# Patient Record
Sex: Male | Born: 1975 | Race: White | Hispanic: No | Marital: Single | Smoking: Current every day smoker
Health system: Southern US, Community
[De-identification: ages and names within clinical notes are randomized; demographics above are authoritative.]

## PROBLEM LIST (undated history)

## (undated) DIAGNOSIS — G43909 Migraine, unspecified, not intractable, without status migrainosus: Secondary | ICD-10-CM

## (undated) DIAGNOSIS — G2581 Restless legs syndrome: Secondary | ICD-10-CM

## (undated) DIAGNOSIS — I10 Essential (primary) hypertension: Secondary | ICD-10-CM

## (undated) DIAGNOSIS — F419 Anxiety disorder, unspecified: Secondary | ICD-10-CM

## (undated) HISTORY — PX: APPENDECTOMY: SHX54

---

## 2008-05-09 ENCOUNTER — Emergency Department (HOSPITAL_COMMUNITY): Admission: EM | Admit: 2008-05-09 | Discharge: 2008-05-09 | Payer: Self-pay | Admitting: Emergency Medicine

## 2016-07-02 ENCOUNTER — Encounter: Payer: Self-pay | Admitting: Intensive Care

## 2016-07-02 ENCOUNTER — Emergency Department
Admission: EM | Admit: 2016-07-02 | Discharge: 2016-07-02 | Disposition: A | Discharge status: Home / Self Care | Payer: Self-pay | Attending: Emergency Medicine | Admitting: Emergency Medicine

## 2016-07-02 DIAGNOSIS — R1010 Upper abdominal pain, unspecified: Secondary | ICD-10-CM | POA: Insufficient documentation

## 2016-07-02 DIAGNOSIS — R1030 Lower abdominal pain, unspecified: Secondary | ICD-10-CM | POA: Insufficient documentation

## 2016-07-02 DIAGNOSIS — R112 Nausea with vomiting, unspecified: Secondary | ICD-10-CM | POA: Insufficient documentation

## 2016-07-02 DIAGNOSIS — F17210 Nicotine dependence, cigarettes, uncomplicated: Secondary | ICD-10-CM | POA: Insufficient documentation

## 2016-07-02 LAB — COMPREHENSIVE METABOLIC PANEL
ALBUMIN: 4 g/dL (ref 3.5–5.0)
ALT: 21 U/L (ref 17–63)
ANION GAP: 8 (ref 5–15)
AST: 23 U/L (ref 15–41)
Alkaline Phosphatase: 60 U/L (ref 38–126)
BILIRUBIN TOTAL: 0.5 mg/dL (ref 0.3–1.2)
BUN: 9 mg/dL (ref 6–20)
CALCIUM: 9.3 mg/dL (ref 8.9–10.3)
CO2: 25 mmol/L (ref 22–32)
CREATININE: 1.09 mg/dL (ref 0.61–1.24)
Chloride: 105 mmol/L (ref 101–111)
GFR calc Af Amer: >60 mL/min (ref >60)
GFR calc non Af Amer: >60 mL/min (ref >60)
GLUCOSE: 92 mg/dL (ref 65–99)
POTASSIUM: 4.2 mmol/L (ref 3.5–5.1)
SODIUM: 138 mmol/L (ref 135–145)
TOTAL PROTEIN: 7.1 g/dL (ref 6.5–8.1)

## 2016-07-02 LAB — CBC WITH DIFFERENTIAL/PLATELET
BASOS ABS: 0 10*3/uL (ref 0–0.1)
Basophils Relative: 0 %
Eosinophils Absolute: 0.1 10*3/uL (ref 0–0.7)
Eosinophils Relative: 1 %
HEMATOCRIT: 44.6 % (ref 40.0–52.0)
Hemoglobin: 15.5 g/dL (ref 13.0–18.0)
LYMPHS ABS: 1.6 10*3/uL (ref 1.0–3.6)
Lymphocytes Relative: 22 %
MCH: 30.3 pg (ref 26.0–34.0)
MCHC: 34.7 g/dL (ref 32.0–36.0)
MCV: 87.2 fL (ref 80.0–100.0)
MONO ABS: 0.6 10*3/uL (ref 0.2–1.0)
MONOS PCT: 8 %
NEUTROS ABS: 5 10*3/uL (ref 1.4–6.5)
Neutrophils Relative %: 69 %
Platelets: 275 10*3/uL (ref 150–440)
RBC: 5.11 MIL/uL (ref 4.40–5.90)
RDW: 12.9 % (ref 11.5–14.5)
WBC: 7.3 10*3/uL (ref 3.8–10.6)

## 2016-07-02 LAB — URINALYSIS, COMPLETE (UACMP) WITH MICROSCOPIC
Bacteria, UA: NONE SEEN
Bilirubin Urine: NEGATIVE
GLUCOSE, UA: NEGATIVE mg/dL
Ketones, ur: 5 mg/dL — AB
Leukocytes, UA: NEGATIVE
NITRITE: NEGATIVE
PH: 5 (ref 5.0–8.0)
PROTEIN: 30 mg/dL — AB
SPECIFIC GRAVITY, URINE: 1.028 (ref 1.005–1.030)
Squamous Epithelial / LPF: NONE SEEN

## 2016-07-02 LAB — LIPASE, BLOOD: Lipase: 20 U/L (ref 11–51)

## 2016-07-02 MED ORDER — ONDANSETRON 4 MG PO TBDP
4.0000 mg | ORAL_TABLET | Freq: Once | ORAL | Status: AC | PRN
  Administered 2016-07-02: 4 mg via ORAL
  Filled 2016-07-02: qty 1

## 2016-07-02 MED ORDER — ONDANSETRON 4 MG PO TBDP: 4.0000 mg | ORAL_TABLET | Freq: Three times a day (TID) | ORAL | 0 refills | Status: AC | PRN

## 2016-07-02 MED ORDER — MORPHINE SULFATE (PF) 4 MG/ML IV SOLN
4.0000 mg | Freq: Once | INTRAVENOUS | Status: AC
  Administered 2016-07-02: 4 mg via INTRAVENOUS
  Filled 2016-07-02: qty 1

## 2016-07-02 MED ORDER — SODIUM CHLORIDE 0.9 % IV SOLN
Freq: Once | INTRAVENOUS | Status: AC
  Administered 2016-07-02: 14:00:00 via INTRAVENOUS

## 2016-07-02 MED ORDER — SODIUM CHLORIDE 0.9 % IV SOLN
Freq: Once | INTRAVENOUS | Status: AC
  Administered 2016-07-02: 1000 mL via INTRAVENOUS

## 2016-07-02 MED ORDER — FAMOTIDINE 20 MG PO TABS: 20.0000 mg | ORAL_TABLET | Freq: Two times a day (BID) | ORAL | 1 refills | Status: AC

## 2016-07-02 MED ORDER — ONDANSETRON HCL 4 MG/2ML IJ SOLN
4.0000 mg | Freq: Once | INTRAMUSCULAR | Status: AC
  Administered 2016-07-02: 4 mg via INTRAVENOUS
  Filled 2016-07-02: qty 2

## 2016-07-02 NOTE — ED Triage Notes (Signed)
Patient arrived by POV for c/o emesis and lower abdominal pain since last night. He believes he may have eaten chicken that was not cooked all the way. Denies diarrhea. Multiple episodes of emesis since last night. Ambulatory in triage with no problems.

## 2016-07-02 NOTE — ED Provider Notes (Signed)
[facility] Emergency Department Provider Note       Time seen: ----------------------------------------- 1:02 PM on 07/02/2016 -----------------------------------------     I have reviewed the triage vital signs and the nursing notes.   HISTORY   Chief Complaint Abdominal Pain and Emesis    HPI Alex Miranda is a 41 y.o. male who presents to the ED for vomiting with lower abdominal pain since last night. Patient believes he may have eaten chicken that was not cooked well now. He denies any diarrhea, he had multiple episodes of vomiting last night. Currently pain is 5 out of 10 in the lower abdomen. Nothing makes it better or worse. Patient feels dehydrated.   History reviewed. No pertinent past medical history.  There are no active problems to display for this patient.   Past Surgical History:  Procedure Laterality Date  . APPENDECTOMY      Allergies Patient has no known allergies.  Social History Social History  Substance Use Topics  . Smoking status: Current Every Day Smoker    Packs/day: 1.00    Types: Cigarettes  . Smokeless tobacco: Never Used  . Alcohol use No    Review of Systems Constitutional: Negative for fever. Cardiovascular: Negative for chest pain. Respiratory: Negative for shortness of breath. Gastrointestinal: Positive for abdominal pain, vomiting Genitourinary: Negative for dysuria. Musculoskeletal: Negative for back pain. Skin: Negative for rash. Neurological: Negative for headaches, focal weakness or numbness.  10-point ROS otherwise negative.  ____________________________________________   PHYSICAL EXAM:  VITAL SIGNS: ED Triage Vitals  Enc Vitals Group     BP 07/02/16 1143 (!) 141/95     Pulse Rate 07/02/16 1143 92     Resp 07/02/16 1143 18     Temp 07/02/16 1143 97.5 F (36.4 C)     Temp Source 07/02/16 1143 Oral     SpO2 07/02/16 1143 100 %     Weight 07/02/16 1143 215 lb (97.5 kg)     Height  07/02/16 1143 6' (1.829 m)     Head Circumference --      Peak Flow --      Pain Score 07/02/16 1159 5     Pain Loc --      Pain Edu? --      Excl. in GC? --     Constitutional: Alert and oriented. Well appearing and in no distress. Eyes: Conjunctivae are normal. PERRL. Normal extraocular movements. ENT   Head: Normocephalic and atraumatic.   Nose: No congestion/rhinnorhea.   Mouth/Throat: Mucous membranes are moist.   Neck: No stridor. Cardiovascular: Normal rate, regular rhythm. No murmurs, rubs, or gallops. Respiratory: Normal respiratory effort without tachypnea nor retractions. Breath sounds are clear and equal bilaterally. No wheezes/rales/rhonchi. Gastrointestinal: Soft and nontender. Nonfocal abdominal tenderness, no rebound or guarding. Normal bowel sounds. Musculoskeletal: Nontender with normal range of motion in extremities. No lower extremity tenderness nor edema. Neurologic:  Normal speech and language. No gross focal neurologic deficits are appreciated.  Skin:  Skin is warm, dry and intact. No rash noted. Psychiatric: Mood and affect are normal. Speech and behavior are normal.   ____________________________________________  ED COURSE:  Pertinent labs & imaging results that were available during my care of the patient were reviewed by me and considered in my medical decision making (see chart for details). Patient presents for abdominal pain and vomiting, we will assess with labs and imaging as indicated.   Procedures ____________________________________________   LABS (pertinent positives/negatives)  Labs Reviewed  URINALYSIS, COMPLETE (UACMP)  WITH MICROSCOPIC - Abnormal; Notable for the following:       Result Value   Color, Urine YELLOW (*)    APPearance CLEAR (*)    Hgb urine dipstick SMALL (*)    Ketones, ur 5 (*)    Protein, ur 30 (*)    All other components within normal limits  LIPASE, BLOOD  COMPREHENSIVE METABOLIC PANEL  CBC WITH  DIFFERENTIAL/PLATELET   ____________________________________________  FINAL ASSESSMENT AND PLAN  Abdominal pain and vomiting  Plan: Patient's labs and imaging were dictated above. Patient had presented for abdominal pain and vomiting, currently he is improved with fluids and antiemetics. He is stable for outpatient follow-up.   Emily Filbert, MD   Note: This note was generated in part or whole with voice recognition software. Voice recognition is usually quite accurate but there are transcription errors that can and very often do occur. I apologize for any typographical errors that were not detected and corrected.     Emily Filbert, MD 07/02/16 1318

## 2016-08-13 ENCOUNTER — Emergency Department: Payer: 59

## 2016-08-13 ENCOUNTER — Emergency Department
Admission: EM | Admit: 2016-08-13 | Discharge: 2016-08-13 | Disposition: A | Discharge status: Home / Self Care | Payer: 59 | Attending: Student in an Organized Health Care Education/Training Program | Admitting: Student in an Organized Health Care Education/Training Program

## 2016-08-13 ENCOUNTER — Encounter: Payer: Self-pay | Admitting: Intensive Care

## 2016-08-13 DIAGNOSIS — I10 Essential (primary) hypertension: Secondary | ICD-10-CM | POA: Insufficient documentation

## 2016-08-13 DIAGNOSIS — F17210 Nicotine dependence, cigarettes, uncomplicated: Secondary | ICD-10-CM | POA: Insufficient documentation

## 2016-08-13 DIAGNOSIS — K1120 Sialoadenitis, unspecified: Secondary | ICD-10-CM | POA: Insufficient documentation

## 2016-08-13 DIAGNOSIS — H9202 Otalgia, left ear: Secondary | ICD-10-CM | POA: Diagnosis present

## 2016-08-13 HISTORY — DX: Anxiety disorder, unspecified: F41.9

## 2016-08-13 HISTORY — DX: Essential (primary) hypertension: I10

## 2016-08-13 HISTORY — DX: Restless legs syndrome: G25.81

## 2016-08-13 LAB — CBC WITH DIFFERENTIAL/PLATELET
Basophils Absolute: 0 10*3/uL (ref 0–0.1)
Basophils Relative: 0 %
EOS PCT: 1 %
Eosinophils Absolute: 0 10*3/uL (ref 0–0.7)
HCT: 43.9 % (ref 40.0–52.0)
Hemoglobin: 15.1 g/dL (ref 13.0–18.0)
LYMPHS ABS: 1.1 10*3/uL (ref 1.0–3.6)
Lymphocytes Relative: 19 %
MCH: 29.6 pg (ref 26.0–34.0)
MCHC: 34.4 g/dL (ref 32.0–36.0)
MCV: 86 fL (ref 80.0–100.0)
MONO ABS: 0.4 10*3/uL (ref 0.2–1.0)
Monocytes Relative: 7 %
Neutro Abs: 4.1 10*3/uL (ref 1.4–6.5)
Neutrophils Relative %: 73 %
PLATELETS: 212 10*3/uL (ref 150–440)
RBC: 5.11 MIL/uL (ref 4.40–5.90)
RDW: 12.8 % (ref 11.5–14.5)
WBC: 5.7 10*3/uL (ref 3.8–10.6)

## 2016-08-13 LAB — URINALYSIS, COMPLETE (UACMP) WITH MICROSCOPIC
Bilirubin Urine: NEGATIVE
GLUCOSE, UA: NEGATIVE mg/dL
KETONES UR: NEGATIVE mg/dL
Leukocytes, UA: NEGATIVE
NITRITE: NEGATIVE
PH: 6 (ref 5.0–8.0)
PROTEIN: 30 mg/dL — AB
Specific Gravity, Urine: 1.027 (ref 1.005–1.030)
WBC UA: NONE SEEN WBC/hpf (ref 0–5)

## 2016-08-13 LAB — COMPREHENSIVE METABOLIC PANEL
ALT: 19 U/L (ref 17–63)
AST: 23 U/L (ref 15–41)
Albumin: 4.1 g/dL (ref 3.5–5.0)
Alkaline Phosphatase: 66 U/L (ref 38–126)
Anion gap: 6 (ref 5–15)
BUN: 15 mg/dL (ref 6–20)
CHLORIDE: 105 mmol/L (ref 101–111)
CO2: 28 mmol/L (ref 22–32)
Calcium: 9.4 mg/dL (ref 8.9–10.3)
Creatinine, Ser: 1.01 mg/dL (ref 0.61–1.24)
Glucose, Bld: 101 mg/dL — ABNORMAL HIGH (ref 65–99)
POTASSIUM: 4.2 mmol/L (ref 3.5–5.1)
Sodium: 139 mmol/L (ref 135–145)
Total Bilirubin: 0.3 mg/dL (ref 0.3–1.2)
Total Protein: 7.3 g/dL (ref 6.5–8.1)

## 2016-08-13 MED ORDER — IOPAMIDOL (ISOVUE-300) INJECTION 61%
75.0000 mL | Freq: Once | INTRAVENOUS | Status: AC | PRN
  Administered 2016-08-13: 75 mL via INTRAVENOUS
  Filled 2016-08-13: qty 75

## 2016-08-13 MED ORDER — PREDNISONE 20 MG PO TABS: 20.0000 mg | ORAL_TABLET | Freq: Every day | ORAL | 0 refills | Status: AC

## 2016-08-13 MED ORDER — KETOROLAC TROMETHAMINE 30 MG/ML IJ SOLN
30.0000 mg | Freq: Once | INTRAMUSCULAR | Status: AC
  Administered 2016-08-13: 30 mg via INTRAVENOUS
  Filled 2016-08-13: qty 1

## 2016-08-13 NOTE — Discharge Instructions (Signed)
Your exam, labs, and CT scan were normal today. There is no sign of infection or abscess. Take the prescription steroid for swelling and inflammation. Drink plenty of fluid and follow-up with the ear, nose, & throat specialist as needed. Select a local clinic for routine medical care. Return to the ED as needed.

## 2016-08-13 NOTE — ED Triage Notes (Signed)
Patient presents today with L ear pain X2 days. Minimal swelling noted to L jaw. Denies trouble swallowing or SOB. A&O x4

## 2016-08-13 NOTE — ED Provider Notes (Signed)
[facility] Emergency Department Provider Note ____________________________________________  Time seen: 1527  I have reviewed the triage vital signs and the nursing notes.  HISTORY  Chief Complaint  Otalgia  HPI Alex Miranda is a 41 y.o. male presents to the ED with complaints of left ear pain for the last 2 days. Patient denies any fevers, but notes chills and sweats.He denies any injury, accident, or trauma. He also denies any hearing changes, vertigo, or tinnitus. He notes a mild headache as well as pain and pressure inside the left ear. He denies any recent dental work, ear drainage, or water in the ear. He overall reports to feeling of general malaise. He dosed 400mg  of ibuprofen this morning for pain.  Past Medical History:  Diagnosis Date  . Anxiety   . Hypertension   . Restless leg syndrome     There are no active problems to display for this patient.   Past Surgical History:  Procedure Laterality Date  . APPENDECTOMY      Prior to Admission medications   Medication Sig Start Date End Date Taking? Authorizing Provider  famotidine (PEPCID) 20 MG tablet Take 1 tablet (20 mg total) by mouth 2 (two) times daily. 07/02/16   Emily FilbertWilliams, Jonathan E, MD  ondansetron (ZOFRAN ODT) 4 MG disintegrating tablet Take 1 tablet (4 mg total) by mouth every 8 (eight) hours as needed for nausea or vomiting. 07/02/16   Emily FilbertWilliams, Jonathan E, MD  predniSONE (DELTASONE) 20 MG tablet Take 1 tablet (20 mg total) by mouth daily with breakfast. 08/13/16 08/18/16  Kaegan Hettich, Charlesetta IvoryJenise V Bacon, PA-C    Allergies Patient has no known allergies.  History reviewed. No pertinent family history.  Social History Social History  Substance Use Topics  . Smoking status: Current Every Day Smoker    Packs/day: 1.00    Types: Cigarettes  . Smokeless tobacco: Never Used  . Alcohol use No    Review of Systems  Constitutional: Negative for fever. Admits to chills and sweats Eyes:  Negative for visual changes. ENT: Negative for sore throat. Left earache as above. Cardiovascular: Negative for chest pain. Respiratory: Negative for shortness of breath. Gastrointestinal: Negative for abdominal pain, vomiting and diarrhea. Skin: Negative for rash. Neurological: Negative for focal weakness or numbness. Reports general headache. ____________________________________________  PHYSICAL EXAM:  VITAL SIGNS: ED Triage Vitals  Enc Vitals Group     BP 08/13/16 1503 (!) 138/100     Pulse Rate 08/13/16 1503 96     Resp 08/13/16 1503 18     Temp 08/13/16 1503 98.3 F (36.8 C)     Temp Source 08/13/16 1503 Oral     SpO2 08/13/16 1503 99 %     Weight --      Height --      Head Circumference --      Peak Flow --      Pain Score 08/13/16 1535 8     Pain Loc --      Pain Edu? --      Excl. in GC? --     Constitutional: Alert and oriented. Well appearing and in no distress. Head: Normocephalic and atraumatic. She with subtle fullness noted to the left cheek and jaw region. Eyes: Conjunctivae are normal. PERRL. Normal extraocular movements Ears: Canals clear without edema or exudate bilaterally. TMs intact bilaterally, no erythema, bulging, or effusion noted. Nose: No congestion/rhinorrhea/epistaxis. Mouth/Throat: Mucous membranes are moist. Uvula is midline and tonsils are flat. No gum swelling, or sublingual erythema  noted. Neck: Supple. No thyromegaly. Hematological/Lymphatic/Immunological: No cervical lymphadenopathy. Cardiovascular: Normal rate, regular rhythm. Normal distal pulses. Respiratory: Normal respiratory effort. No wheezes/rales/rhonchi. Musculoskeletal: Mandible and jaw movement without indication of TMJ dysfunction. Nontender with normal range of motion in all extremities.  Neurologic:  Normal gait without ataxia. Normal speech and language. No gross focal neurologic deficits are appreciated. Skin:  Skin is warm, dry and intact. No rash  noted. ____________________________________________   LABS (pertinent positives/negatives) Labs Reviewed  COMPREHENSIVE METABOLIC PANEL - Abnormal; Notable for the following:       Result Value   Glucose, Bld 101 (*)    All other components within normal limits  URINALYSIS, COMPLETE (UACMP) WITH MICROSCOPIC - Abnormal; Notable for the following:    Color, Urine YELLOW (*)    APPearance CLEAR (*)    Hgb urine dipstick SMALL (*)    Protein, ur 30 (*)    Bacteria, UA RARE (*)    Squamous Epithelial / LPF 0-5 (*)    All other components within normal limits  CBC WITH DIFFERENTIAL/PLATELET  ____________________________________________   RADIOLOGY  CT Soft Tissue Neck w/ CM  IMPRESSION: No acute process in the neck.  Lung apical tree-in-bud infiltrates may be infectious or inflammatory. ____________________________________________  PROCEDURES  Toradol 30 mg IVP ____________________________________________  INITIAL IMPRESSION / ASSESSMENT AND PLAN / ED COURSE  Patient with a benign exam and CT scan is reassuring at this time. No acute infectious process involving the middle ear, sinuses, or parotid gland. He does appear to have some subtle facial swelling which may represent a mild parotitis. He'll be discharged with a prescription for prednisone to take as directed. He is encouraged to follow-up with his primary care provider or ENT for ongoing symptom management. Referral was made to local adult clinic for management of routine medical conditions. ____________________________________________  FINAL CLINICAL IMPRESSION(S) / ED DIAGNOSES  Final diagnoses:  Left ear pain  Parotiditis      Lissa Hoard, PA-C 08/13/16 1744    Willy Eddy, MD 08/13/16 [phone]

## 2016-09-02 ENCOUNTER — Encounter: Payer: Self-pay | Admitting: Emergency Medicine

## 2016-09-02 ENCOUNTER — Emergency Department
Admission: EM | Admit: 2016-09-02 | Discharge: 2016-09-02 | Disposition: A | Discharge status: Home / Self Care | Payer: 59 | Attending: Emergency Medicine | Admitting: Emergency Medicine

## 2016-09-02 DIAGNOSIS — I10 Essential (primary) hypertension: Secondary | ICD-10-CM | POA: Diagnosis not present

## 2016-09-02 DIAGNOSIS — Z79899 Other long term (current) drug therapy: Secondary | ICD-10-CM | POA: Insufficient documentation

## 2016-09-02 DIAGNOSIS — F17210 Nicotine dependence, cigarettes, uncomplicated: Secondary | ICD-10-CM | POA: Insufficient documentation

## 2016-09-02 DIAGNOSIS — G43909 Migraine, unspecified, not intractable, without status migrainosus: Secondary | ICD-10-CM | POA: Diagnosis present

## 2016-09-02 DIAGNOSIS — G43001 Migraine without aura, not intractable, with status migrainosus: Secondary | ICD-10-CM

## 2016-09-02 HISTORY — DX: Migraine, unspecified, not intractable, without status migrainosus: G43.909

## 2016-09-02 MED ORDER — METOCLOPRAMIDE HCL 5 MG/ML IJ SOLN
20.0000 mg | Freq: Once | INTRAVENOUS | Status: AC
  Administered 2016-09-02: 20 mg via INTRAVENOUS
  Filled 2016-09-02 (×2): qty 4

## 2016-09-02 MED ORDER — DIPHENHYDRAMINE HCL 50 MG/ML IJ SOLN
25.0000 mg | Freq: Once | INTRAMUSCULAR | Status: AC
  Administered 2016-09-02: 25 mg via INTRAVENOUS
  Filled 2016-09-02: qty 1

## 2016-09-02 MED ORDER — BUTALBITAL-APAP-CAFFEINE 50-325-40 MG PO TABS: 1.0000 | ORAL_TABLET | Freq: Four times a day (QID) | ORAL | 0 refills | Status: AC | PRN

## 2016-09-02 MED ORDER — ONDANSETRON HCL 4 MG/2ML IJ SOLN
4.0000 mg | Freq: Once | INTRAMUSCULAR | Status: AC
  Administered 2016-09-02: 4 mg via INTRAVENOUS
  Filled 2016-09-02: qty 2

## 2016-09-02 MED ORDER — SODIUM CHLORIDE 0.9 % IV BOLUS (SEPSIS)
1000.0000 mL | Freq: Once | INTRAVENOUS | Status: AC
  Administered 2016-09-02: 1000 mL via INTRAVENOUS

## 2016-09-02 MED ORDER — KETOROLAC TROMETHAMINE 30 MG/ML IJ SOLN
30.0000 mg | Freq: Once | INTRAMUSCULAR | Status: AC
  Administered 2016-09-02: 30 mg via INTRAVENOUS
  Filled 2016-09-02: qty 1

## 2016-09-02 NOTE — ED Notes (Signed)
Pt to ed with c/o migraine since yesterday, reports normally uses maxalt and fioricet for migraines but states he is out of meds.  Reports he tried IBU at home without relief.  +blurred vision, +photosensitvity and pt also reports loud noises make pain worse.  Pt also reports he has vomited x 2 with this headache.  Pt alert and oriented at this time, skin warm and dry.

## 2016-09-02 NOTE — ED Triage Notes (Signed)
Migraine since yesterday, history of same.  

## 2016-09-02 NOTE — ED Provider Notes (Signed)
[facility] Emergency Department Provider Note   ____________________________________________   First MD Initiated Contact with Patient 09/02/16 1350     (approximate)  I have reviewed the triage vital signs and the nursing notes.   HISTORY  Chief Complaint Migraine    HPI Alex Miranda is a 41 y.o. male  Patient complaining of migraine headache which began yesterday. Patient state history of migraine headache headache the last 2 years of this magnitude. Patient said no relief with over-the-counter preparations. Patient denies photophobia but stated there is nausea and vomiting. Patient rates his pain as 8/10.   Past Medical History:  Diagnosis Date  . Anxiety   . Hypertension   . Migraines   . Restless leg syndrome     There are no active problems to display for this patient.   Past Surgical History:  Procedure Laterality Date  . APPENDECTOMY      Prior to Admission medications   Medication Sig Start Date End Date Taking? Authorizing Provider  famotidine (PEPCID) 20 MG tablet Take 1 tablet (20 mg total) by mouth 2 (two) times daily. 07/02/16   Emily FilbertWilliams, Jonathan E, MD  ondansetron (ZOFRAN ODT) 4 MG disintegrating tablet Take 1 tablet (4 mg total) by mouth every 8 (eight) hours as needed for nausea or vomiting. 07/02/16   Emily FilbertWilliams, Jonathan E, MD    Allergies Patient has no known allergies.  No family history on file.  Social History Social History  Substance Use Topics  . Smoking status: Current Every Day Smoker    Packs/day: 1.00    Types: Cigarettes  . Smokeless tobacco: Never Used  . Alcohol use No    Review of Systems  Constitutional: No fever/chills Eyes: No visual changes. ENT: No sore throat. Cardiovascular: Denies chest pain. Respiratory: Denies shortness of breath. Gastrointestinal: No abdominal pain.  No nausea, no vomiting.  No diarrhea.  No constipation. Genitourinary: Negative for dysuria. Musculoskeletal:  Negative for back pain. Skin: Negative for rash. Neurological: Negative for headaches, focal weakness or numbness. Psychiatric: Anxiety Endocrine: hypertension ____________________________________________   PHYSICAL EXAM:  VITAL SIGNS: ED Triage Vitals  Enc Vitals Group     BP 09/02/16 1240 (!) 149/100     Pulse Rate 09/02/16 1240 (!) 110     Resp 09/02/16 1240 20     Temp 09/02/16 1240 98.4 F (36.9 C)     Temp Source 09/02/16 1240 Oral     SpO2 09/02/16 1240 96 %     Weight 09/02/16 1241 200 lb (90.7 kg)     Height 09/02/16 1241 6' (1.829 m)     Head Circumference --      Peak Flow --      Pain Score 09/02/16 1240 8     Pain Loc --      Pain Edu? --      Excl. in GC? --     Constitutional: Alert and oriented. Well appearing and in no acute distress. Eyes: Conjunctivae are normal. PERRL. EOMI. Head: Atraumatic. Nose: No congestion/rhinnorhea. Mouth/Throat: Mucous membranes are moist.  Oropharynx non-erythematous. Neck: No stridor.  No cervical spine tenderness to palpation. Hematological/Lymphatic/Immunilogical: No cervical lymphadenopathy. Cardiovascular: Normal rate, regular rhythm. Grossly normal heart sounds.  Good peripheral circulation. Respiratory: Normal respiratory effort.  No retractions. Lungs CTAB. Gastrointestinal: Soft and nontender. No distention. No abdominal bruits. No CVA tenderness. Musculoskeletal: No lower extremity tenderness nor edema.  No joint effusions. Neurologic:  Normal speech and language. No gross focal neurologic deficits are appreciated.  No gait instability. Skin:  Skin is warm, dry and intact. No rash noted. Psychiatric: Mood and affect are normal. Speech and behavior are normal.  ____________________________________________   LABS (all labs ordered are listed, but only abnormal results are displayed)  Labs Reviewed - No data to  display ____________________________________________  EKG   ____________________________________________  RADIOLOGY  No results found.  ____________________________________________   PROCEDURES  Procedure(s) performed: None  Procedures  Critical Care performed: No  ____________________________________________   INITIAL IMPRESSION / ASSESSMENT AND PLAN / ED COURSE  Pertinent labs & imaging results that were available during my care of the patient were reviewed by me and considered in my medical decision making (see chart for details).   migraine headache.  Status post IV hydration, Toradol, Zofran, and Reglan patient was given discharge care instructions and a work note. Patient advised to follow-up with PCP for continual care.      ____________________________________________   FINAL CLINICAL IMPRESSION(S) / ED DIAGNOSES  Final diagnoses:  None      NEW MEDICATIONS STARTED DURING THIS VISIT:  New Prescriptions   No medications on file     Note:  This document was prepared using Dragon voice recognition software and may include unintentional dictation errors.    Joni Reining, PA-C 09/02/16 1402    Merrily Brittle, MD 09/02/16 [phone]

## 2018-06-20 IMAGING — CT CT NECK W/ CM
5 of 6 series · 14 of 33 positions shown, 16 images · IV contrast (iopamidol)
Comparison: None.

CLINICAL DATA: LEFT ear pain for 2 days, minimal LEFT jaw swelling.
Evaluate LEFT parotid gland swelling.

EXAM:
CT NECK WITH CONTRAST
TECHNIQUE: Multidetector CT imaging of the neck was performed using the
standard protocol following the bolus administration of intravenous
contrast.
CONTRAST:  75mL IIPKPQ-RQQ IOPAMIDOL (IIPKPQ-RQQ) INJECTION 61%

[Series 2: axial neck · axial · 0.55mm/px · z∈[-192,-110]mm · 2 of 124 slices shown, 3 images]
[im 42/124  soft-tissue]
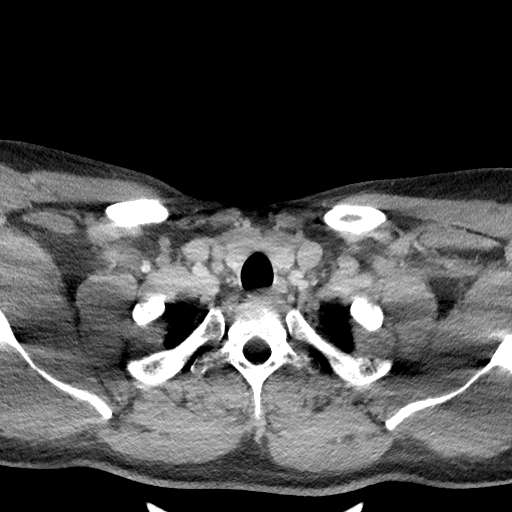
[im 42/124  bone]
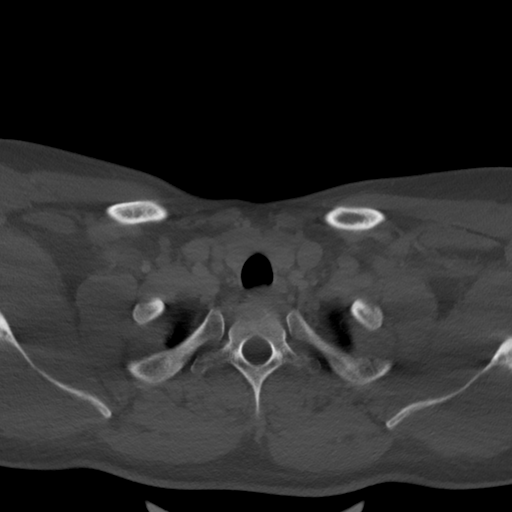
[im 83/124  bone]
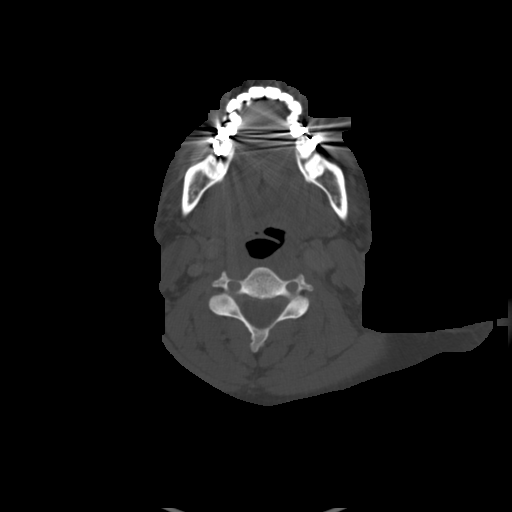

[Series 6: sag neck · sagittal · 0.50mm/px · 5 of 83 slices shown, 6 images]
[im 28/83  bone]
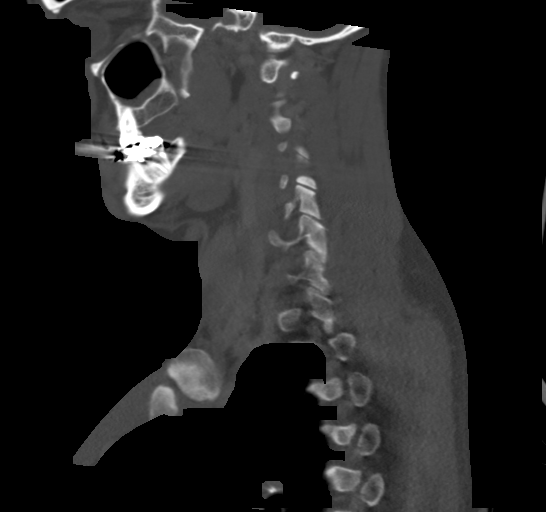
[im 35/83  bone]
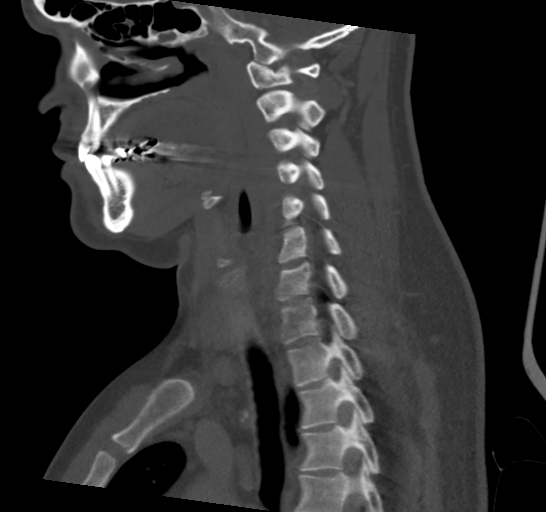
[im 42/83  soft-tissue]
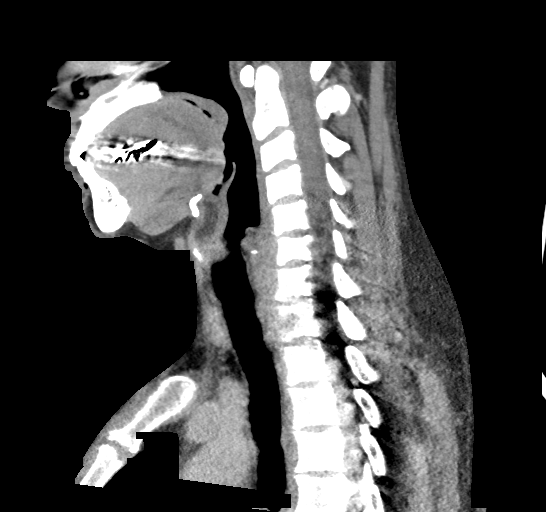
[im 42/83  bone]
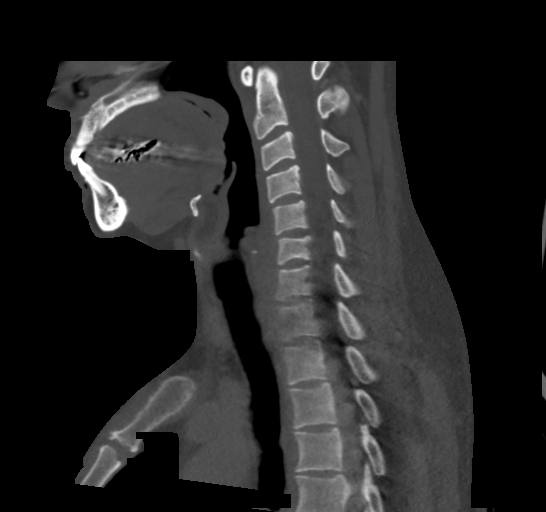
[im 48/83  bone]
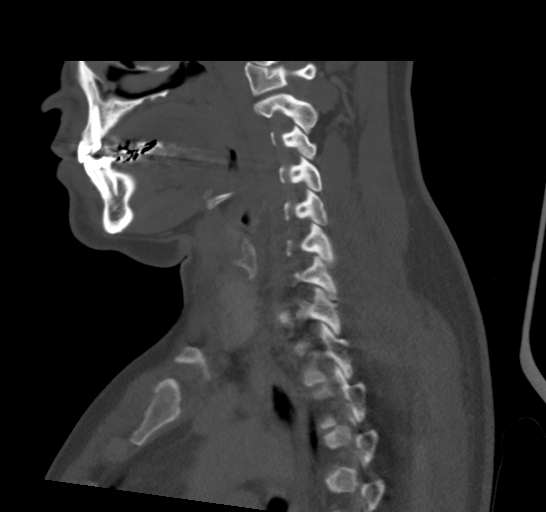
[im 55/83  bone]
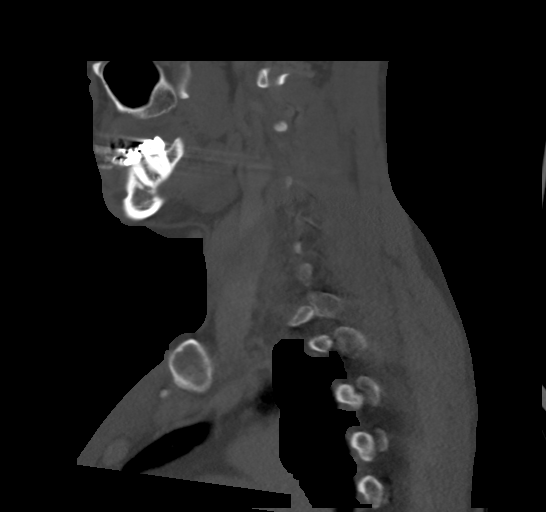

[Series 7: cor neck · coronal · 0.36mm/px · 3 of 99 slices shown]
[im 20/99  bone]
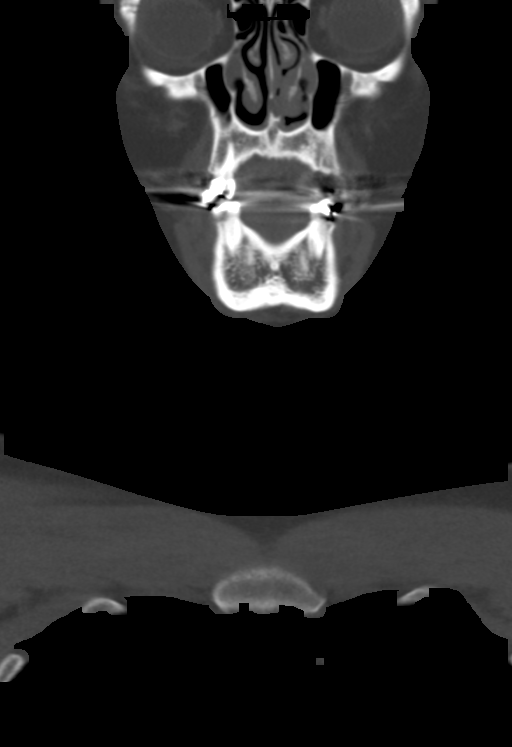
[im 40/99  bone]
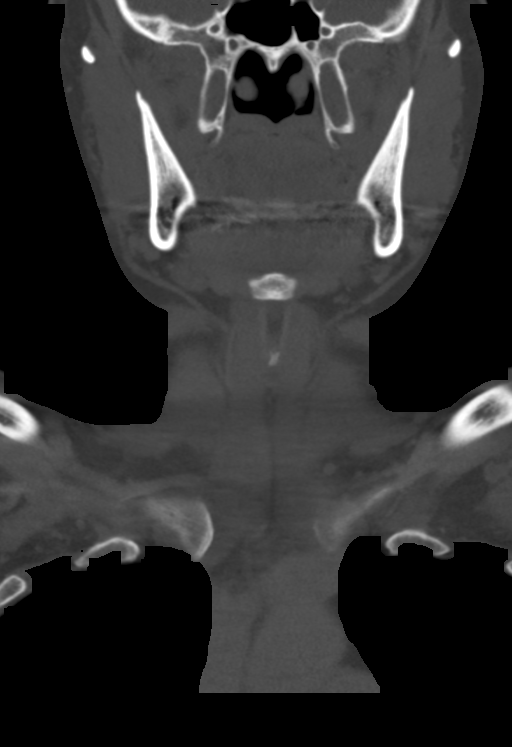
[im 59/99  bone]
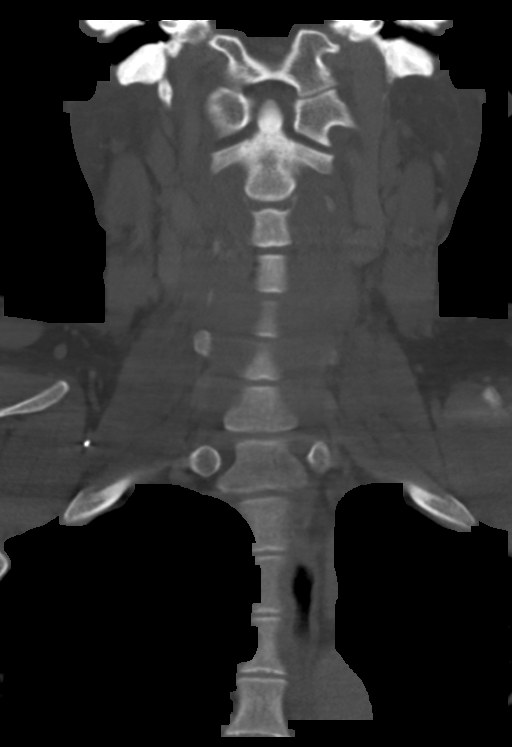

[Series 8: orthogonal ax · axial · 0.36mm/px · z∈[-209,-129]mm · 2 of 123 slices shown]
[im 41/123  bone]
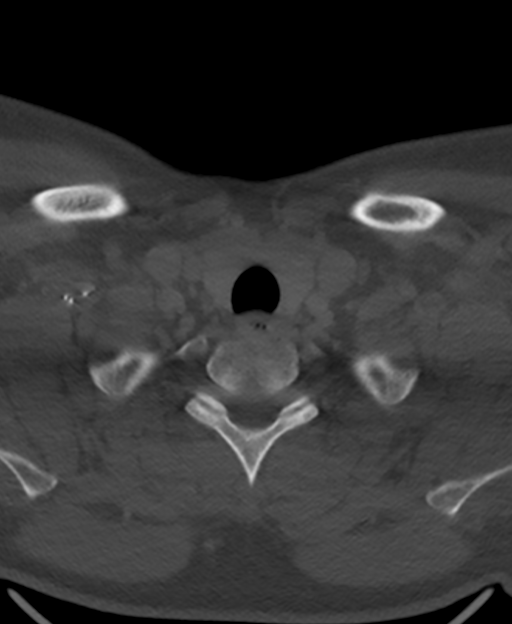
[im 82/123  bone]
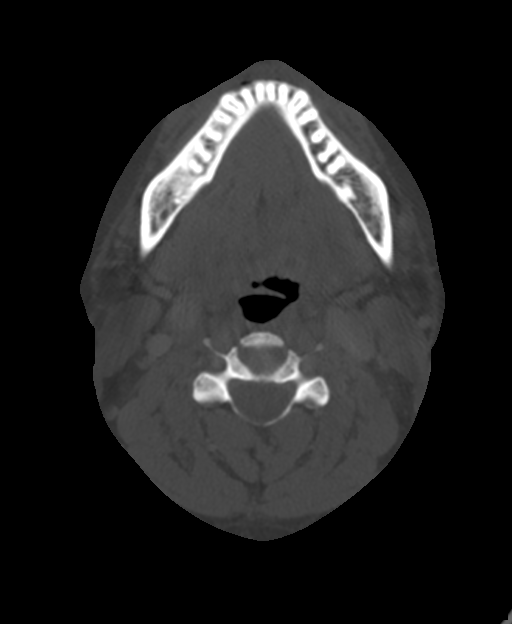

[Series 9: axial neck (person_name) · axial · 0.55mm/px · z∈[-192,-110]mm · 2 of 124 slices shown]
[im 42/124  bone]
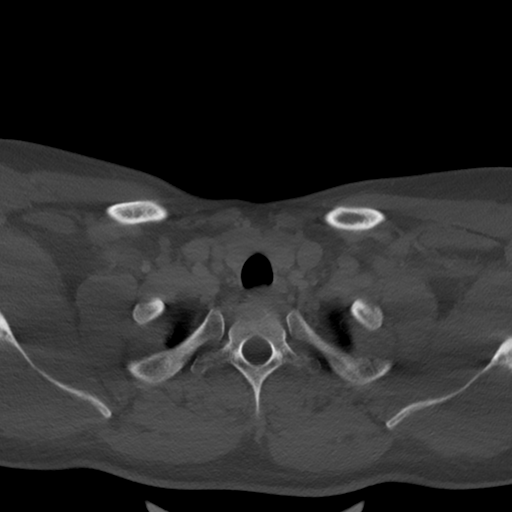
[im 83/124  bone]
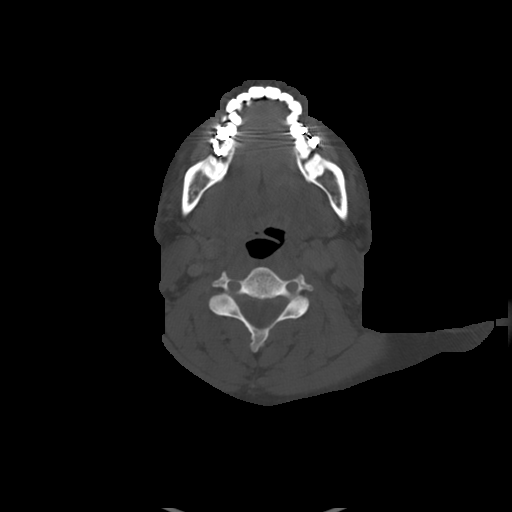

[14 of 33 positions shown; findings below may reference images not displayed]

FINDINGS: PHARYNX AND LARYNX: Normal.  Widely patent airway.

SALIVARY GLANDS: Normal.

THYROID: Normal.

LYMPH NODES: No lymphadenopathy by CT size criteria.

VASCULAR: Normal.

LIMITED INTRACRANIAL: Normal.

VISUALIZED ORBITS: Normal.

MASTOIDS AND VISUALIZED PARANASAL SINUSES: Mild RIGHT maxillary
mucosal thickening without paranasal sinus air-fluid levels. Imaged
mastoid air cells are well-aerated though, under pneumatized. Imaged
middle ears are well aerated.

SKELETON: Nonacute. No CT findings of temporomandibular
osteoarthrosis.

UPPER CHEST: Scattered tree-in-bud infiltrates, 4 mm sub solid LEFT
upper lobe pulmonary nodule image 116/124, no indicated follow-up.
Calcified hilar and mediastinal lymph nodes.

OTHER: No facial asymmetry or soft tissue swelling, no fluid
collection.
IMPRESSION: No acute process in the neck.

Lung apical tree-in-bud infiltrates may be infectious or
inflammatory.
